# Patient Record
Sex: Male | Born: 1965 | Race: White | Hispanic: No | Marital: Married | Smoking: Never smoker
Health system: Southern US, Community
[De-identification: ages and names within clinical notes are randomized; demographics above are authoritative.]

## PROBLEM LIST (undated history)

## (undated) DIAGNOSIS — K760 Fatty (change of) liver, not elsewhere classified: Secondary | ICD-10-CM

## (undated) DIAGNOSIS — K219 Gastro-esophageal reflux disease without esophagitis: Secondary | ICD-10-CM

## (undated) DIAGNOSIS — F4310 Post-traumatic stress disorder, unspecified: Secondary | ICD-10-CM

## (undated) DIAGNOSIS — I10 Essential (primary) hypertension: Secondary | ICD-10-CM

## (undated) HISTORY — PX: WISDOM TOOTH EXTRACTION: SHX21

## (undated) HISTORY — DX: Gastro-esophageal reflux disease without esophagitis: K21.9

## (undated) HISTORY — DX: Essential (primary) hypertension: I10

## (undated) HISTORY — PX: LIVER BIOPSY: SHX301

## (undated) HISTORY — DX: Fatty (change of) liver, not elsewhere classified: K76.0

## (undated) HISTORY — DX: Hemochromatosis, unspecified: E83.119

## (undated) HISTORY — DX: Post-traumatic stress disorder, unspecified: F43.10

---

## 2000-06-26 ENCOUNTER — Ambulatory Visit (HOSPITAL_COMMUNITY): Admission: RE | Admit: 2000-06-26 | Discharge: 2000-06-26 | Payer: Self-pay | Admitting: Internal Medicine

## 2001-04-11 ENCOUNTER — Ambulatory Visit (HOSPITAL_COMMUNITY): Admission: RE | Admit: 2001-04-11 | Discharge: 2001-04-11 | Payer: Self-pay | Admitting: Internal Medicine

## 2001-07-18 ENCOUNTER — Ambulatory Visit (HOSPITAL_COMMUNITY): Admission: RE | Admit: 2001-07-18 | Discharge: 2001-07-18 | Payer: Self-pay | Admitting: Internal Medicine

## 2001-10-16 ENCOUNTER — Ambulatory Visit (HOSPITAL_COMMUNITY): Admission: RE | Admit: 2001-10-16 | Discharge: 2001-10-16 | Payer: Self-pay | Admitting: Internal Medicine

## 2002-10-29 ENCOUNTER — Ambulatory Visit (HOSPITAL_COMMUNITY): Admission: RE | Admit: 2002-10-29 | Discharge: 2002-10-29 | Payer: Self-pay | Admitting: Internal Medicine

## 2003-06-03 ENCOUNTER — Encounter (HOSPITAL_COMMUNITY): Admission: RE | Admit: 2003-06-03 | Discharge: 2003-07-03 | Payer: Self-pay | Admitting: Internal Medicine

## 2003-07-28 ENCOUNTER — Encounter (HOSPITAL_COMMUNITY): Admission: RE | Admit: 2003-07-28 | Discharge: 2003-08-27 | Payer: Self-pay | Admitting: Internal Medicine

## 2003-09-01 ENCOUNTER — Ambulatory Visit (HOSPITAL_COMMUNITY): Admission: RE | Admit: 2003-09-01 | Discharge: 2003-09-01 | Payer: Self-pay | Admitting: Internal Medicine

## 2003-12-16 ENCOUNTER — Encounter (HOSPITAL_COMMUNITY): Admission: RE | Admit: 2003-12-16 | Discharge: 2004-01-15 | Payer: Self-pay | Admitting: Internal Medicine

## 2004-04-05 ENCOUNTER — Encounter (HOSPITAL_COMMUNITY): Admission: RE | Admit: 2004-04-05 | Discharge: 2004-05-05 | Payer: Self-pay | Admitting: Oncology

## 2004-06-15 ENCOUNTER — Ambulatory Visit (HOSPITAL_COMMUNITY): Admission: RE | Admit: 2004-06-15 | Discharge: 2004-06-15 | Payer: Self-pay | Admitting: Internal Medicine

## 2004-12-14 ENCOUNTER — Ambulatory Visit (HOSPITAL_COMMUNITY): Admission: RE | Admit: 2004-12-14 | Discharge: 2004-12-14 | Payer: Self-pay | Admitting: *Deleted

## 2005-01-16 ENCOUNTER — Ambulatory Visit: Payer: Self-pay | Admitting: Internal Medicine

## 2005-01-30 ENCOUNTER — Ambulatory Visit (HOSPITAL_COMMUNITY): Admission: RE | Admit: 2005-01-30 | Discharge: 2005-01-30 | Payer: Self-pay | Admitting: Internal Medicine

## 2005-06-19 ENCOUNTER — Ambulatory Visit (HOSPITAL_COMMUNITY): Admission: RE | Admit: 2005-06-19 | Discharge: 2005-06-19 | Payer: Self-pay | Admitting: Internal Medicine

## 2005-07-19 ENCOUNTER — Ambulatory Visit (HOSPITAL_COMMUNITY): Admission: RE | Admit: 2005-07-19 | Discharge: 2005-07-19 | Payer: Self-pay | Admitting: Internal Medicine

## 2005-11-17 ENCOUNTER — Ambulatory Visit (HOSPITAL_COMMUNITY): Admission: RE | Admit: 2005-11-17 | Discharge: 2005-11-17 | Payer: Self-pay | Admitting: Internal Medicine

## 2006-02-27 ENCOUNTER — Ambulatory Visit (HOSPITAL_COMMUNITY): Admission: RE | Admit: 2006-02-27 | Discharge: 2006-02-27 | Payer: Self-pay | Admitting: Internal Medicine

## 2006-03-01 ENCOUNTER — Ambulatory Visit: Payer: Self-pay | Admitting: Internal Medicine

## 2006-03-25 ENCOUNTER — Emergency Department (HOSPITAL_COMMUNITY): Admission: EM | Admit: 2006-03-25 | Discharge: 2006-03-25 | Payer: Self-pay | Admitting: Emergency Medicine

## 2006-04-09 ENCOUNTER — Ambulatory Visit (HOSPITAL_COMMUNITY): Admission: RE | Admit: 2006-04-09 | Discharge: 2006-04-09 | Payer: Self-pay | Admitting: Internal Medicine

## 2006-05-21 ENCOUNTER — Ambulatory Visit (HOSPITAL_COMMUNITY): Admission: RE | Admit: 2006-05-21 | Discharge: 2006-05-21 | Payer: Self-pay | Admitting: Internal Medicine

## 2006-07-27 ENCOUNTER — Ambulatory Visit (HOSPITAL_COMMUNITY): Admission: RE | Admit: 2006-07-27 | Discharge: 2006-07-27 | Payer: Self-pay | Admitting: Internal Medicine

## 2006-12-26 ENCOUNTER — Ambulatory Visit (HOSPITAL_COMMUNITY): Admission: RE | Admit: 2006-12-26 | Discharge: 2006-12-26 | Payer: Self-pay | Admitting: Internal Medicine

## 2007-01-15 ENCOUNTER — Ambulatory Visit (HOSPITAL_COMMUNITY): Admission: RE | Admit: 2007-01-15 | Discharge: 2007-01-15 | Payer: Self-pay | Admitting: Internal Medicine

## 2007-02-25 ENCOUNTER — Ambulatory Visit (HOSPITAL_COMMUNITY): Admission: RE | Admit: 2007-02-25 | Discharge: 2007-02-25 | Payer: Self-pay | Admitting: Internal Medicine

## 2007-06-08 ENCOUNTER — Emergency Department (HOSPITAL_COMMUNITY): Admission: EM | Admit: 2007-06-08 | Discharge: 2007-06-09 | Payer: Self-pay | Admitting: Emergency Medicine

## 2007-07-10 ENCOUNTER — Ambulatory Visit (HOSPITAL_COMMUNITY): Admission: RE | Admit: 2007-07-10 | Discharge: 2007-07-10 | Payer: Self-pay | Admitting: Internal Medicine

## 2007-07-30 ENCOUNTER — Ambulatory Visit (HOSPITAL_COMMUNITY): Admission: RE | Admit: 2007-07-30 | Discharge: 2007-07-30 | Payer: Self-pay | Admitting: Internal Medicine

## 2007-10-08 ENCOUNTER — Ambulatory Visit (HOSPITAL_COMMUNITY): Admission: RE | Admit: 2007-10-08 | Discharge: 2007-10-08 | Payer: Self-pay | Admitting: Internal Medicine

## 2007-10-10 ENCOUNTER — Encounter: Admission: RE | Admit: 2007-10-10 | Discharge: 2007-10-10 | Payer: Self-pay | Admitting: Sports Medicine

## 2008-01-20 ENCOUNTER — Ambulatory Visit (HOSPITAL_COMMUNITY): Admission: RE | Admit: 2008-01-20 | Discharge: 2008-01-20 | Payer: Self-pay | Admitting: Internal Medicine

## 2008-04-01 ENCOUNTER — Ambulatory Visit (HOSPITAL_COMMUNITY): Admission: RE | Admit: 2008-04-01 | Discharge: 2008-04-01 | Payer: Self-pay | Admitting: Internal Medicine

## 2008-04-07 ENCOUNTER — Ambulatory Visit (HOSPITAL_COMMUNITY): Admission: RE | Admit: 2008-04-07 | Discharge: 2008-04-07 | Payer: Self-pay | Admitting: Internal Medicine

## 2008-08-17 ENCOUNTER — Ambulatory Visit (HOSPITAL_COMMUNITY): Admission: RE | Admit: 2008-08-17 | Discharge: 2008-08-17 | Payer: Self-pay | Admitting: Internal Medicine

## 2008-09-16 ENCOUNTER — Ambulatory Visit (HOSPITAL_COMMUNITY): Admission: RE | Admit: 2008-09-16 | Discharge: 2008-09-16 | Payer: Self-pay | Admitting: Internal Medicine

## 2009-05-10 ENCOUNTER — Ambulatory Visit (HOSPITAL_COMMUNITY): Admission: RE | Admit: 2009-05-10 | Discharge: 2009-05-10 | Payer: Self-pay | Admitting: Internal Medicine

## 2009-09-14 ENCOUNTER — Ambulatory Visit (HOSPITAL_COMMUNITY): Payer: Self-pay | Admitting: Internal Medicine

## 2009-09-14 ENCOUNTER — Encounter (HOSPITAL_COMMUNITY): Admission: RE | Admit: 2009-09-14 | Discharge: 2009-10-14 | Payer: Self-pay | Admitting: Internal Medicine

## 2009-12-03 ENCOUNTER — Ambulatory Visit (HOSPITAL_COMMUNITY): Payer: Self-pay | Admitting: Internal Medicine

## 2009-12-03 ENCOUNTER — Encounter (HOSPITAL_COMMUNITY)
Admission: RE | Admit: 2009-12-03 | Discharge: 2010-01-02 | Payer: Self-pay | Source: Home / Self Care | Admitting: Internal Medicine

## 2010-01-25 ENCOUNTER — Encounter (HOSPITAL_COMMUNITY)
Admission: RE | Admit: 2010-01-25 | Discharge: 2010-02-24 | Payer: Self-pay | Source: Home / Self Care | Attending: Internal Medicine | Admitting: Internal Medicine

## 2010-01-25 ENCOUNTER — Ambulatory Visit (HOSPITAL_COMMUNITY): Payer: Self-pay | Admitting: Internal Medicine

## 2010-04-06 ENCOUNTER — Encounter (HOSPITAL_COMMUNITY): Payer: 59

## 2010-04-20 ENCOUNTER — Encounter (HOSPITAL_COMMUNITY): Payer: 59 | Attending: Oncology

## 2010-04-20 ENCOUNTER — Encounter (HOSPITAL_COMMUNITY): Payer: 59

## 2010-05-11 ENCOUNTER — Emergency Department (HOSPITAL_COMMUNITY)
Admission: EM | Admit: 2010-05-11 | Discharge: 2010-05-11 | Disposition: A | Discharge status: Home / Self Care | Payer: 59 | Attending: Emergency Medicine | Admitting: Emergency Medicine

## 2010-05-11 DIAGNOSIS — H5789 Other specified disorders of eye and adnexa: Secondary | ICD-10-CM | POA: Insufficient documentation

## 2010-05-11 DIAGNOSIS — H5710 Ocular pain, unspecified eye: Secondary | ICD-10-CM | POA: Insufficient documentation

## 2010-05-11 DIAGNOSIS — X58XXXA Exposure to other specified factors, initial encounter: Secondary | ICD-10-CM | POA: Insufficient documentation

## 2010-05-11 DIAGNOSIS — S058X9A Other injuries of unspecified eye and orbit, initial encounter: Secondary | ICD-10-CM | POA: Insufficient documentation

## 2010-05-15 LAB — THERAPEUTIC PHLEBOTOMY

## 2010-05-29 LAB — THERAPEUTIC PHLEBOTOMY: Volume Drawn: 500

## 2010-06-29 ENCOUNTER — Encounter (HOSPITAL_COMMUNITY): Payer: 59

## 2010-07-06 ENCOUNTER — Encounter (HOSPITAL_COMMUNITY): Payer: 59 | Attending: Oncology

## 2010-07-26 ENCOUNTER — Ambulatory Visit (INDEPENDENT_AMBULATORY_CARE_PROVIDER_SITE_OTHER): Payer: 59 | Admitting: Internal Medicine

## 2010-09-14 ENCOUNTER — Encounter (HOSPITAL_COMMUNITY): Payer: 59 | Attending: Oncology

## 2010-09-14 LAB — COMPREHENSIVE METABOLIC PANEL
BUN: 16 mg/dL (ref 6–23)
CO2: 25 mEq/L (ref 19–32)
Chloride: 101 mEq/L (ref 96–112)
Creatinine, Ser: 1.29 mg/dL (ref 0.50–1.35)
GFR calc Af Amer: >60 mL/min (ref >60)
GFR calc non Af Amer: 60 mL/min — ABNORMAL LOW (ref >60)
Glucose, Bld: 83 mg/dL (ref 70–99)
Total Bilirubin: 0.6 mg/dL (ref 0.3–1.2)

## 2010-09-14 LAB — CBC
HCT: 42.6 % (ref 39.0–52.0)
MCH: 30.4 pg (ref 26.0–34.0)
MCV: 88.6 fL (ref 78.0–100.0)
RBC: 4.81 MIL/uL (ref 4.22–5.81)
WBC: 5.9 10*3/uL (ref 4.0–10.5)

## 2010-09-14 LAB — IRON AND TIBC
Iron: 156 ug/dL — ABNORMAL HIGH (ref 42–135)
TIBC: 261 ug/dL (ref 215–435)

## 2010-09-14 NOTE — Progress Notes (Signed)
Phlebotomy started at 0948 and completed at 0954. 500 cc removed. Vitals stable. Patient tolerated well. (Vitals obtained before phlebotomy and they were stable however I did not enter the BP/HR because it disappeared from the screen and I could not retrieve).

## 2010-09-21 ENCOUNTER — Telehealth (INDEPENDENT_AMBULATORY_CARE_PROVIDER_SITE_OTHER): Payer: Self-pay | Admitting: *Deleted

## 2010-09-21 NOTE — Telephone Encounter (Signed)
The patient was called and made aware of recent lab results and Dr. Tenna Child recommendation to have Phlebotomy every 8 weeks due to increased Iron and Saturation Levels. Iron Studies are to obtained at the time of the 3rd Phlebotomy, and patient to have  Office visit with Dr. Karilyn Cota.

## 2010-09-22 ENCOUNTER — Encounter (INDEPENDENT_AMBULATORY_CARE_PROVIDER_SITE_OTHER): Payer: Self-pay | Admitting: *Deleted

## 2010-09-22 NOTE — Progress Notes (Signed)
APPT SET NUR NEXT AVALB KR

## 2010-10-13 ENCOUNTER — Ambulatory Visit (INDEPENDENT_AMBULATORY_CARE_PROVIDER_SITE_OTHER): Payer: 59 | Admitting: Internal Medicine

## 2010-10-26 ENCOUNTER — Encounter (HOSPITAL_COMMUNITY): Payer: 59

## 2010-11-09 ENCOUNTER — Encounter (HOSPITAL_COMMUNITY): Payer: 59 | Attending: Oncology

## 2010-11-09 NOTE — Progress Notes (Signed)
vp x 1 to lt ac for phlebotomy. 500cc of blood removed. IV site wnl. Patient tolerated very well.

## 2010-11-10 LAB — THERAPEUTIC PHLEBOTOMY: Volume Drawn: 500

## 2010-11-16 LAB — THERAPEUTIC PHLEBOTOMY

## 2010-11-17 LAB — THERAPEUTIC PHLEBOTOMY

## 2010-11-22 LAB — THERAPEUTIC PHLEBOTOMY

## 2010-11-23 ENCOUNTER — Encounter (HOSPITAL_COMMUNITY): Payer: 59

## 2010-11-29 LAB — THERAPEUTIC PHLEBOTOMY

## 2011-01-04 ENCOUNTER — Encounter (HOSPITAL_COMMUNITY): Payer: 59

## 2011-01-04 ENCOUNTER — Encounter (HOSPITAL_COMMUNITY): Payer: 59 | Attending: Internal Medicine

## 2011-01-04 NOTE — Progress Notes (Signed)
Phlebotomy started at 0910 and ended at 0915. 500 cc blood removed. IV site WNL. Patient tolerated well.

## 2011-03-01 ENCOUNTER — Other Ambulatory Visit (HOSPITAL_COMMUNITY): Payer: 59

## 2011-03-01 ENCOUNTER — Encounter (HOSPITAL_COMMUNITY): Payer: 59

## 2011-03-01 ENCOUNTER — Encounter (HOSPITAL_COMMUNITY): Payer: 59 | Attending: Internal Medicine

## 2011-03-01 LAB — CBC
MCV: 87.3 fL (ref 78.0–100.0)
Platelets: 207 10*3/uL (ref 150–400)
RBC: 5.21 MIL/uL (ref 4.22–5.81)
WBC: 5.7 10*3/uL (ref 4.0–10.5)

## 2011-03-01 LAB — COMPREHENSIVE METABOLIC PANEL
ALT: 37 U/L (ref 0–53)
AST: 26 U/L (ref 0–37)
CO2: 30 mEq/L (ref 19–32)
Chloride: 104 mEq/L (ref 96–112)
Creatinine, Ser: 1.21 mg/dL (ref 0.50–1.35)
GFR calc non Af Amer: 70 mL/min — ABNORMAL LOW (ref >90)
Sodium: 139 mEq/L (ref 135–145)
Total Bilirubin: 0.5 mg/dL (ref 0.3–1.2)

## 2011-03-02 LAB — IRON AND TIBC
Saturation Ratios: 63 % — ABNORMAL HIGH (ref 20–55)
TIBC: 254 ug/dL (ref 215–435)
UIBC: 94 ug/dL — ABNORMAL LOW (ref 125–400)

## 2011-03-08 ENCOUNTER — Encounter (HOSPITAL_COMMUNITY): Payer: 59

## 2011-03-09 ENCOUNTER — Telehealth (INDEPENDENT_AMBULATORY_CARE_PROVIDER_SITE_OTHER): Payer: Self-pay | Admitting: *Deleted

## 2011-03-09 NOTE — Telephone Encounter (Signed)
Per NUR the patient will need labs on 6 months, they are noted. patient will be sent a letter as a reminder

## 2011-04-26 ENCOUNTER — Encounter (HOSPITAL_COMMUNITY): Payer: 59 | Attending: Internal Medicine

## 2011-04-26 NOTE — Progress Notes (Signed)
Billy Myers presents today for phlebotomy per MD orders. Phlebotomy procedure started at 0920 am and ended at 0925 am. 500 cc removed. Patient observed for 30 minutes after procedure without any incident. Patient tolerated procedure well. IV needle removed intact.

## 2011-06-07 ENCOUNTER — Other Ambulatory Visit: Payer: Self-pay | Admitting: Occupational Medicine

## 2011-06-07 ENCOUNTER — Ambulatory Visit: Payer: Self-pay

## 2011-06-07 DIAGNOSIS — R52 Pain, unspecified: Secondary | ICD-10-CM

## 2011-06-21 ENCOUNTER — Encounter (HOSPITAL_COMMUNITY): Payer: 59

## 2011-07-05 ENCOUNTER — Encounter (HOSPITAL_COMMUNITY): Payer: 59

## 2011-07-19 ENCOUNTER — Encounter (HOSPITAL_COMMUNITY): Payer: 59

## 2011-07-26 ENCOUNTER — Encounter (HOSPITAL_COMMUNITY): Payer: 59 | Attending: Internal Medicine

## 2011-07-26 NOTE — Progress Notes (Signed)
Billy Myers presents today for phlebotomy per MD orders. Phlebotomy procedure started at 1015 and ended at  1020. 500 cc removed. Patient tolerated procedure well. IV needle removed intact.

## 2011-08-17 ENCOUNTER — Encounter (INDEPENDENT_AMBULATORY_CARE_PROVIDER_SITE_OTHER): Payer: Self-pay | Admitting: *Deleted

## 2011-08-17 ENCOUNTER — Other Ambulatory Visit (INDEPENDENT_AMBULATORY_CARE_PROVIDER_SITE_OTHER): Payer: Self-pay | Admitting: *Deleted

## 2011-09-27 ENCOUNTER — Encounter (HOSPITAL_COMMUNITY): Payer: 59 | Attending: Internal Medicine

## 2011-09-27 LAB — COMPREHENSIVE METABOLIC PANEL
AST: 25 U/L (ref 0–37)
Alkaline Phosphatase: 67 U/L (ref 39–117)
BUN: 12 mg/dL (ref 6–23)
CO2: 23 mEq/L (ref 19–32)
Chloride: 100 mEq/L (ref 96–112)
Creatinine, Ser: 1.23 mg/dL (ref 0.50–1.35)
GFR calc non Af Amer: 68 mL/min — ABNORMAL LOW (ref >90)
Potassium: 3.3 mEq/L — ABNORMAL LOW (ref 3.5–5.1)
Total Bilirubin: 0.6 mg/dL (ref 0.3–1.2)

## 2011-09-27 LAB — CBC
Platelets: 234 10*3/uL (ref 150–400)
RBC: 5.29 MIL/uL (ref 4.22–5.81)
RDW: 13 % (ref 11.5–15.5)
WBC: 7.2 10*3/uL (ref 4.0–10.5)

## 2011-09-27 LAB — DIFFERENTIAL
Basophils Absolute: 0.1 10*3/uL (ref 0.0–0.1)
Eosinophils Relative: 2 % (ref 0–5)
Lymphocytes Relative: 22 % (ref 12–46)
Neutro Abs: 5 10*3/uL (ref 1.7–7.7)
Neutrophils Relative %: 69 % (ref 43–77)

## 2011-09-27 NOTE — Progress Notes (Signed)
Berneta Sages Funderburk presents today for phlebotomy per MD orders. Phlebotomy procedure started at 0926 and ended at  0934. 500 cc removed. Patient tolerated procedure well. IV needle removed intact.  First BP taken before procedure at 0925 was 139/92  [facility] presented for labwork. Labs per MD order drawn via Peripheral Line 24 gauge needle inserted in rt ac  Good blood return present. Procedure without incident.  Needle removed intact. Patient tolerated procedure well.

## 2011-09-28 LAB — IRON AND TIBC: TIBC: 240 ug/dL (ref 215–435)

## 2011-09-28 LAB — FERRITIN: Ferritin: 51 ng/mL (ref 22–322)

## 2011-10-04 ENCOUNTER — Encounter (INDEPENDENT_AMBULATORY_CARE_PROVIDER_SITE_OTHER): Payer: Self-pay | Admitting: Internal Medicine

## 2011-10-04 ENCOUNTER — Ambulatory Visit (INDEPENDENT_AMBULATORY_CARE_PROVIDER_SITE_OTHER): Payer: 59 | Admitting: Internal Medicine

## 2011-10-04 DIAGNOSIS — E876 Hypokalemia: Secondary | ICD-10-CM

## 2011-10-04 NOTE — Patient Instructions (Signed)
Fasting metabolic 7 to be done in first week of September. Phlebotomy once a month. CBC, serum iron, TIBC ferritin with sixth phlebotomy.

## 2011-10-04 NOTE — Progress Notes (Signed)
Presenting complaint;  Followup for hemochromatosis. Hemochromatosis diagnosed March 1999.  Subjective:  Patient is 46 year old Caucasian male who was diagnosed with hemachromatosis 14 years ago and is here for scheduled visit. He was last seen in March 2011 needed in the office. He has no complaints. He has good appetite. He says she works 18 hours a day on most days and is working and under a lot of stress. He states his stress level will decrease as his job assignment changes. He remains with good appetite. He denies abdominal pain. He is not exercising like he was doing before. His weight is down by about 2 pounds since his last visit. He is getting phlebotomy every 2 months.  Current Medications: Current Outpatient Prescriptions  Medication Sig Dispense Refill  . KLOR-CON M20 20 MEQ tablet 20 mEq daily. For 10 days         Objective: Blood pressure 130/88, pulse 76, temperature 98.4 F (36.9 C), temperature source Oral, resp. rate 20, height 6' (1.829 m), weight 206 lb 12.8 oz (93.804 kg). Patient is alert and in no acute distress. Conjunctiva is pink. Sclera is nonicteric Oropharyngeal mucosa is normal. No neck masses or thyromegaly noted. Abdomen is symmetrical soft and nontender. No organomegaly or masses noted. No LE edema or clubbing noted.  Labs/studies Results: Lab data from 09/27/2011. WBC 7.2, H&H 15.7 and 46.7 platelet count 234K Serum sodium 137, potassium 3.3, chloride 100, CO2 23, BUN 12 creatinine 1.23. Glucose 124 Bilirubin 0.6, AP 67, AST 25, ALT 30, albumin 4.1. Calcium 9.5. Serum iron 185, TIBC 240, saturation 77% and serum ferritin. Saturation was 63% and ferritin 26 in January 2013 to   Assessment:  #1 hemochromatosis. Current schedule of phlebotomy every 8 weeks is not enough for him S. iron stores are going in the wrong direction. Transaminases are normal which is reassuring. His phlebotomy needs to be at shorter intervals. #2. Hypokalemia. Patient  does not take diuretics and he does not have diarrhea and he is on regular diet. He was begun on KCL last week for total of 10 days. Doubt that he has kidney disease. #3. Random glucose level of 124.   Plan:  Change phlebotomy schedule to once a month. He'll have iron studies with sixth phlebotomy.  Fasting metabolic 7 in first week of September 2013. Patient was given copy of his blood work. Office visit in one year.

## 2011-10-05 ENCOUNTER — Telehealth (INDEPENDENT_AMBULATORY_CARE_PROVIDER_SITE_OTHER): Payer: Self-pay | Admitting: *Deleted

## 2011-10-05 DIAGNOSIS — E876 Hypokalemia: Secondary | ICD-10-CM

## 2011-10-05 NOTE — Telephone Encounter (Signed)
Per Dr.Rehman the patient will need to have fasting B-Met the first week in September 2013

## 2011-10-19 ENCOUNTER — Encounter (INDEPENDENT_AMBULATORY_CARE_PROVIDER_SITE_OTHER): Payer: Self-pay | Admitting: *Deleted

## 2011-10-19 ENCOUNTER — Other Ambulatory Visit (INDEPENDENT_AMBULATORY_CARE_PROVIDER_SITE_OTHER): Payer: Self-pay | Admitting: *Deleted

## 2011-10-19 DIAGNOSIS — E876 Hypokalemia: Secondary | ICD-10-CM

## 2011-11-15 ENCOUNTER — Ambulatory Visit (HOSPITAL_BASED_OUTPATIENT_CLINIC_OR_DEPARTMENT_OTHER): Payer: 59

## 2011-11-15 NOTE — Progress Notes (Signed)
Billy Myers presents today for phlebotomy per MD orders. Phlebotomy procedure started at 1159 and ended at 1215. 500 cc removed. Patient tolerated procedure well. IV needle removed intact.

## 2011-11-22 ENCOUNTER — Encounter (HOSPITAL_COMMUNITY): Payer: Self-pay

## 2011-12-12 ENCOUNTER — Encounter (HOSPITAL_COMMUNITY): Payer: 59 | Attending: Internal Medicine

## 2011-12-12 NOTE — Progress Notes (Signed)
Billy Myers presents today for phlebotomy per MD orders. Phlebotomy procedure started at 1148 and ended at 1154. 500 cc removed. Patient tolerated procedure well. IV needle removed intact.

## 2011-12-13 ENCOUNTER — Encounter (HOSPITAL_COMMUNITY): Payer: Self-pay

## 2012-01-10 ENCOUNTER — Encounter (HOSPITAL_COMMUNITY): Payer: Self-pay

## 2012-01-10 ENCOUNTER — Encounter (HOSPITAL_COMMUNITY): Payer: 59

## 2012-02-07 ENCOUNTER — Encounter (HOSPITAL_COMMUNITY): Payer: 59 | Attending: Internal Medicine

## 2012-02-07 NOTE — Progress Notes (Signed)
Billy Myers presents today for phlebotomy per MD orders. Phlebotomy procedure started at 0852 and ended at 0857. 500 cc removed. Patient tolerated procedure well. IV needle removed intact.

## 2012-03-06 ENCOUNTER — Encounter (HOSPITAL_COMMUNITY): Payer: Self-pay

## 2012-03-13 ENCOUNTER — Encounter (HOSPITAL_COMMUNITY): Payer: 59 | Attending: Internal Medicine

## 2012-03-13 NOTE — Progress Notes (Signed)
Billy Myers presents today for phlebotomy per MD orders. Phlebotomy procedure started at 1009 and ended at 1018. 500 cc removed. Patient tolerated procedure well. IV needle removed intact.

## 2012-04-03 ENCOUNTER — Encounter (HOSPITAL_COMMUNITY): Payer: 59 | Attending: Internal Medicine

## 2012-04-03 LAB — CBC
HCT: 42.4 % (ref 39.0–52.0)
Hemoglobin: 13.9 g/dL (ref 13.0–17.0)
RDW: 13.1 % (ref 11.5–15.5)
WBC: 5.2 10*3/uL (ref 4.0–10.5)

## 2012-04-03 NOTE — Progress Notes (Signed)
Billy Myers presents today for phlebotomy per MD orders. Phlebotomy procedure started at 0913 and ended at 0918. 500 cc removed. Patient tolerated procedure well. IV needle removed intact.

## 2012-04-04 LAB — FERRITIN: Ferritin: 11 ng/mL — ABNORMAL LOW (ref 22–322)

## 2012-04-04 LAB — IRON AND TIBC
TIBC: 234 ug/dL (ref 215–435)
UIBC: 199 ug/dL (ref 125–400)

## 2012-06-05 ENCOUNTER — Encounter (HOSPITAL_COMMUNITY): Payer: 59 | Attending: Internal Medicine

## 2012-06-05 NOTE — Progress Notes (Signed)
Billy Myers presents today for phlebotomy per MD orders. Phlebotomy procedure started at 1010 and ended at 1016. 500 cc removed. Patient tolerated procedure well. IV needle removed intact.

## 2012-08-07 ENCOUNTER — Encounter (HOSPITAL_COMMUNITY): Payer: 59 | Attending: Internal Medicine

## 2012-08-07 LAB — IRON AND TIBC
Iron: 82 ug/dL (ref 42–135)
Saturation Ratios: 31 % (ref 20–55)
TIBC: 262 ug/dL (ref 215–435)

## 2012-08-07 LAB — CBC
Hemoglobin: 13.5 g/dL (ref 13.0–17.0)
MCH: 26.2 pg (ref 26.0–34.0)
Platelets: 217 10*3/uL (ref 150–400)
RBC: 5.16 MIL/uL (ref 4.22–5.81)
WBC: 4.7 10*3/uL (ref 4.0–10.5)

## 2012-08-07 NOTE — Progress Notes (Signed)
Billy Myers presents today for phlebotomy per MD orders. Phlebotomy procedure started at 0940 and ended at 0945. 500 cc removed. Patient tolerated procedure well. IV needle removed intact.

## 2012-08-22 ENCOUNTER — Telehealth (INDEPENDENT_AMBULATORY_CARE_PROVIDER_SITE_OTHER): Payer: Self-pay | Admitting: *Deleted

## 2012-08-22 NOTE — Telephone Encounter (Signed)
Per Dr.Rehman the patient will need to have lab work in 6 months. These labs have been noted for 6 months.

## 2012-09-02 ENCOUNTER — Other Ambulatory Visit (HOSPITAL_COMMUNITY): Payer: Self-pay | Admitting: *Deleted

## 2012-10-07 ENCOUNTER — Encounter (INDEPENDENT_AMBULATORY_CARE_PROVIDER_SITE_OTHER): Payer: Self-pay | Admitting: *Deleted

## 2012-10-09 ENCOUNTER — Encounter (HOSPITAL_COMMUNITY): Payer: 59 | Attending: Internal Medicine

## 2012-10-09 NOTE — Progress Notes (Signed)
Berneta Sages Sisemore presents today for phlebotomy per MD orders. Phlebotomy procedure started at 0930 and ended at 0940. 500 cc removed. Patient tolerated procedure well. IV needle removed intact.

## 2012-10-17 ENCOUNTER — Ambulatory Visit (INDEPENDENT_AMBULATORY_CARE_PROVIDER_SITE_OTHER): Payer: Self-pay | Admitting: Internal Medicine

## 2012-11-27 ENCOUNTER — Encounter (HOSPITAL_COMMUNITY)
Admission: RE | Admit: 2012-11-27 | Discharge: 2012-11-27 | Disposition: A | Discharge status: Home / Self Care | Payer: 59 | Source: Ambulatory Visit | Attending: Internal Medicine | Admitting: Internal Medicine

## 2012-11-27 ENCOUNTER — Encounter (INDEPENDENT_AMBULATORY_CARE_PROVIDER_SITE_OTHER): Payer: Self-pay

## 2012-11-27 NOTE — Progress Notes (Signed)
Vitals before TPR 153/101,68,20,99%,weight 211.2 8:32 therapeutic phlebotomy began then ended at 8:37 post vital 129/85 98% 74 20, right antcubital site  Without bleeding once needle remove and pressure applied. Monitored patient 15 mins post therapy patient able to take fluids well. No dizziness no shakiness verified by patient. Ambulatory discharge.

## 2012-12-11 ENCOUNTER — Encounter (HOSPITAL_COMMUNITY): Payer: Self-pay

## 2013-02-04 ENCOUNTER — Encounter (HOSPITAL_COMMUNITY)
Admission: RE | Admit: 2013-02-04 | Discharge: 2013-02-04 | Disposition: A | Discharge status: Home / Self Care | Payer: 59 | Source: Ambulatory Visit

## 2013-02-04 ENCOUNTER — Encounter (HOSPITAL_COMMUNITY): Payer: Self-pay

## 2013-02-04 ENCOUNTER — Encounter (HOSPITAL_COMMUNITY)
Admission: RE | Admit: 2013-02-04 | Discharge: 2013-02-04 | Disposition: A | Discharge status: Home / Self Care | Payer: 59 | Source: Ambulatory Visit | Attending: Internal Medicine | Admitting: Internal Medicine

## 2013-02-04 LAB — CBC
HCT: 43.3 % (ref 39.0–52.0)
Hemoglobin: 13.9 g/dL (ref 13.0–17.0)
MCHC: 32.1 g/dL (ref 30.0–36.0)
RBC: 5.32 MIL/uL (ref 4.22–5.81)
RDW: 14.6 % (ref 11.5–15.5)
WBC: 5.4 10*3/uL (ref 4.0–10.5)

## 2013-02-04 LAB — FERRITIN: Ferritin: 10 ng/mL — ABNORMAL LOW (ref 22–322)

## 2013-02-04 LAB — IRON AND TIBC
TIBC: 278 ug/dL (ref 215–435)
UIBC: 186 ug/dL (ref 125–400)

## 2013-02-04 NOTE — Progress Notes (Signed)
Here for theraputic phlebotomy. Iron, TIBC, ferritin, CBC drawn and sent to lab for results.

## 2013-02-04 NOTE — Progress Notes (Signed)
Phlebotomy completed. Blood unit wt 1.4 pounds. Tolerated well.

## 2013-02-04 NOTE — Progress Notes (Deleted)
hgb 11.0, hct 35.9. No procrit required. Discharged to home via w/c in good condition.

## 2013-02-04 NOTE — Progress Notes (Signed)
Phlebotomy started.

## 2013-02-05 ENCOUNTER — Inpatient Hospital Stay (HOSPITAL_COMMUNITY): Admission: RE | Admit: 2013-02-05 | Payer: Self-pay | Source: Ambulatory Visit

## 2013-02-05 NOTE — Progress Notes (Signed)
Results for JERIN, FRANZEL (MRN [id number]) as of 02/05/2013 08:15  Ref. Range 02/04/2013 10:32  Iron Latest Range: 42-135 ug/dL 92  UIBC Latest Range: 125-400 ug/dL 295  TIBC Latest Range: 215-435 ug/dL 284  Saturation Ratios Latest Range: 20-55 % 33  Ferritin Latest Range: 22-322 ng/mL 10 (L)  WBC Latest Range: 4.0-10.5 K/uL 5.4  RBC Latest Range: 4.22-5.81 MIL/uL 5.32  Hemoglobin Latest Range: 13.0-17.0 g/dL 13.2  HCT Latest Range: 39.0-52.0 % 43.3  MCV Latest Range: 78.0-100.0 fL 81.4  MCH Latest Range: 26.0-34.0 pg 26.1  MCHC Latest Range: 30.0-36.0 g/dL 44.0  RDW Latest Range: 11.5-15.5 % 14.6  Platelets Latest Range: 150-400 K/uL 217

## 2013-04-07 ENCOUNTER — Encounter (HOSPITAL_COMMUNITY)
Admission: RE | Admit: 2013-04-07 | Discharge: 2013-04-07 | Disposition: A | Discharge status: Home / Self Care | Payer: 59 | Source: Ambulatory Visit | Attending: Internal Medicine | Admitting: Internal Medicine

## 2013-04-07 ENCOUNTER — Inpatient Hospital Stay (HOSPITAL_COMMUNITY): Admission: RE | Admit: 2013-04-07 | Payer: Self-pay | Source: Ambulatory Visit

## 2013-04-07 NOTE — Progress Notes (Signed)
Billy SagesHarold W Myers presents today for phlebotomy per MD orders.  Phlebotomy procedure started at 1347 and ended at 1353. One unit removed. Patient tolerated procedure well. IV needle removed intact.

## 2013-06-02 ENCOUNTER — Encounter (HOSPITAL_COMMUNITY): Admission: RE | Admit: 2013-06-02 | Payer: 59 | Source: Ambulatory Visit

## 2013-06-03 ENCOUNTER — Encounter (HOSPITAL_COMMUNITY)
Admission: RE | Admit: 2013-06-03 | Discharge: 2013-06-03 | Disposition: A | Discharge status: Home / Self Care | Payer: 59 | Source: Ambulatory Visit | Attending: Internal Medicine | Admitting: Internal Medicine

## 2013-06-03 NOTE — Progress Notes (Signed)
Billy Myers presents today for phlebotomy per MD orders.  Phlebotomy procedure started at 1213 and ended at 1219. 1 unit  removed. Patient tolerated procedure well. IV needle removed intact.

## 2013-07-29 ENCOUNTER — Encounter (HOSPITAL_COMMUNITY): Admission: RE | Admit: 2013-07-29 | Payer: 59 | Source: Ambulatory Visit

## 2013-07-29 ENCOUNTER — Inpatient Hospital Stay (HOSPITAL_COMMUNITY): Admission: RE | Admit: 2013-07-29 | Payer: Self-pay | Source: Ambulatory Visit

## 2013-08-01 ENCOUNTER — Encounter (HOSPITAL_COMMUNITY)
Admission: RE | Admit: 2013-08-01 | Discharge: 2013-08-01 | Disposition: A | Discharge status: Home / Self Care | Payer: 59 | Source: Ambulatory Visit | Attending: Internal Medicine | Admitting: Internal Medicine

## 2013-08-01 LAB — CBC
HCT: 41 % (ref 39.0–52.0)
Hemoglobin: 13.2 g/dL (ref 13.0–17.0)
MCH: 25 pg — AB (ref 26.0–34.0)
MCHC: 32.2 g/dL (ref 30.0–36.0)
MCV: 77.8 fL — ABNORMAL LOW (ref 78.0–100.0)
Platelets: 228 10*3/uL (ref 150–400)
RBC: 5.27 MIL/uL (ref 4.22–5.81)
RDW: 14.6 % (ref 11.5–15.5)
WBC: 5.2 10*3/uL (ref 4.0–10.5)

## 2013-08-01 LAB — IRON AND TIBC
Iron: 29 ug/dL — ABNORMAL LOW (ref 42–135)
Saturation Ratios: 10 % — ABNORMAL LOW (ref 20–55)
TIBC: 281 ug/dL (ref 215–435)
UIBC: 252 ug/dL (ref 125–400)

## 2013-08-01 LAB — FERRITIN: FERRITIN: 7 ng/mL — AB (ref 22–322)

## 2013-08-01 NOTE — Progress Notes (Signed)
Results for Billy Myers, Billy Myers (MRN [id number]) as of 08/01/2013 15:32  Ref. Range 08/01/2013 09:30  Iron Latest Range: 42-135 ug/dL 29 (L)  UIBC Latest Range: 125-400 ug/dL 409252  TIBC Latest Range: 215-435 ug/dL 811281  Saturation Ratios Latest Range: 20-55 % 10 (L)  Ferritin Latest Range: 22-322 ng/mL 7 (L)  WBC Latest Range: 4.0-10.5 K/uL 5.2  RBC Latest Range: 4.22-5.81 MIL/uL 5.27  Hemoglobin Latest Range: 13.0-17.0 g/dL 91.413.2  HCT Latest Range: 39.0-52.0 % 41.0  MCV Latest Range: 78.0-100.0 fL 77.8 (L)  MCH Latest Range: 26.0-34.0 pg 25.0 (L)  MCHC Latest Range: 30.0-36.0 g/dL 78.232.2  RDW Latest Range: 11.5-15.5 % 14.6  Platelets Latest Range: 150-400 K/uL 228

## 2013-08-01 NOTE — Progress Notes (Signed)
Billy SagesHarold W Myers presents today for phlebotomy per MD orders. HGB/HCT:pending but not necessary before performing phlebotomy Phlebotomy procedure started at 0926 and ended at 0935. 1 unit removed. Patient tolerated procedure well. IV needle removed intact.

## 2013-08-14 ENCOUNTER — Encounter (INDEPENDENT_AMBULATORY_CARE_PROVIDER_SITE_OTHER): Payer: Self-pay

## 2013-09-23 ENCOUNTER — Encounter (HOSPITAL_COMMUNITY): Payer: Self-pay

## 2013-09-26 ENCOUNTER — Encounter (HOSPITAL_COMMUNITY)
Admission: RE | Admit: 2013-09-26 | Discharge: 2013-09-26 | Disposition: A | Discharge status: Home / Self Care | Payer: 59 | Source: Ambulatory Visit | Attending: Internal Medicine | Admitting: Internal Medicine

## 2015-06-16 HISTORY — PX: COLONOSCOPY: SHX174

## 2015-06-16 HISTORY — PX: ESOPHAGOGASTRODUODENOSCOPY: SHX1529

## 2015-06-16 LAB — HM COLONOSCOPY

## 2015-06-21 HISTORY — PX: ESOPHAGEAL DILATION: SHX303

## 2015-11-03 ENCOUNTER — Encounter (HOSPITAL_COMMUNITY): Payer: Self-pay

## 2015-11-05 ENCOUNTER — Other Ambulatory Visit (HOSPITAL_COMMUNITY): Payer: Self-pay | Admitting: *Deleted

## 2015-11-08 ENCOUNTER — Ambulatory Visit (HOSPITAL_COMMUNITY)
Admission: RE | Admit: 2015-11-08 | Discharge: 2015-11-08 | Disposition: A | Discharge status: Home / Self Care | Payer: 59 | Source: Ambulatory Visit | Attending: Unknown Physician Specialty | Admitting: Unknown Physician Specialty

## 2015-11-09 LAB — POCT HEMOGLOBIN-HEMACUE: Hemoglobin: 15.3 g/dL (ref 13.0–17.0)

## 2015-12-14 ENCOUNTER — Other Ambulatory Visit (HOSPITAL_COMMUNITY): Payer: Self-pay

## 2015-12-15 ENCOUNTER — Inpatient Hospital Stay (HOSPITAL_COMMUNITY): Admission: RE | Admit: 2015-12-15 | Payer: Self-pay | Source: Ambulatory Visit

## 2016-01-25 ENCOUNTER — Ambulatory Visit (HOSPITAL_COMMUNITY)
Admission: RE | Admit: 2016-01-25 | Discharge: 2016-01-25 | Disposition: A | Discharge status: Home / Self Care | Payer: 59 | Source: Ambulatory Visit | Attending: Unknown Physician Specialty | Admitting: Unknown Physician Specialty

## 2016-01-25 LAB — POCT HEMOGLOBIN-HEMACUE: Hemoglobin: 16 g/dL (ref 13.0–17.0)

## 2016-01-25 NOTE — Progress Notes (Signed)
Pt came in today for scheduled therapeutic phlebotomy.  Pts hemocue prior to procedure was 16.0  Left AC was used.  500cc of blood was removed.  Pt tolerated procedure well.

## 2016-03-01 ENCOUNTER — Inpatient Hospital Stay (HOSPITAL_COMMUNITY): Admission: RE | Admit: 2016-03-01 | Payer: Self-pay | Source: Ambulatory Visit

## 2016-05-29 ENCOUNTER — Ambulatory Visit
Admission: RE | Admit: 2016-05-29 | Discharge: 2016-05-29 | Disposition: A | Discharge status: Home / Self Care | Payer: No Typology Code available for payment source | Source: Ambulatory Visit | Attending: Occupational Medicine | Admitting: Occupational Medicine

## 2016-05-29 ENCOUNTER — Other Ambulatory Visit: Payer: Self-pay | Admitting: Occupational Medicine

## 2016-05-29 DIAGNOSIS — Z0289 Encounter for other administrative examinations: Secondary | ICD-10-CM

## 2018-06-04 IMAGING — CR DG CHEST 2V
2 series · 2 of 2 positions shown · non-contrast
Comparison: None

CLINICAL DATA: Routine encounter for physical exam related to
employment

EXAM:
CHEST  2 VIEW

[w chest pa]
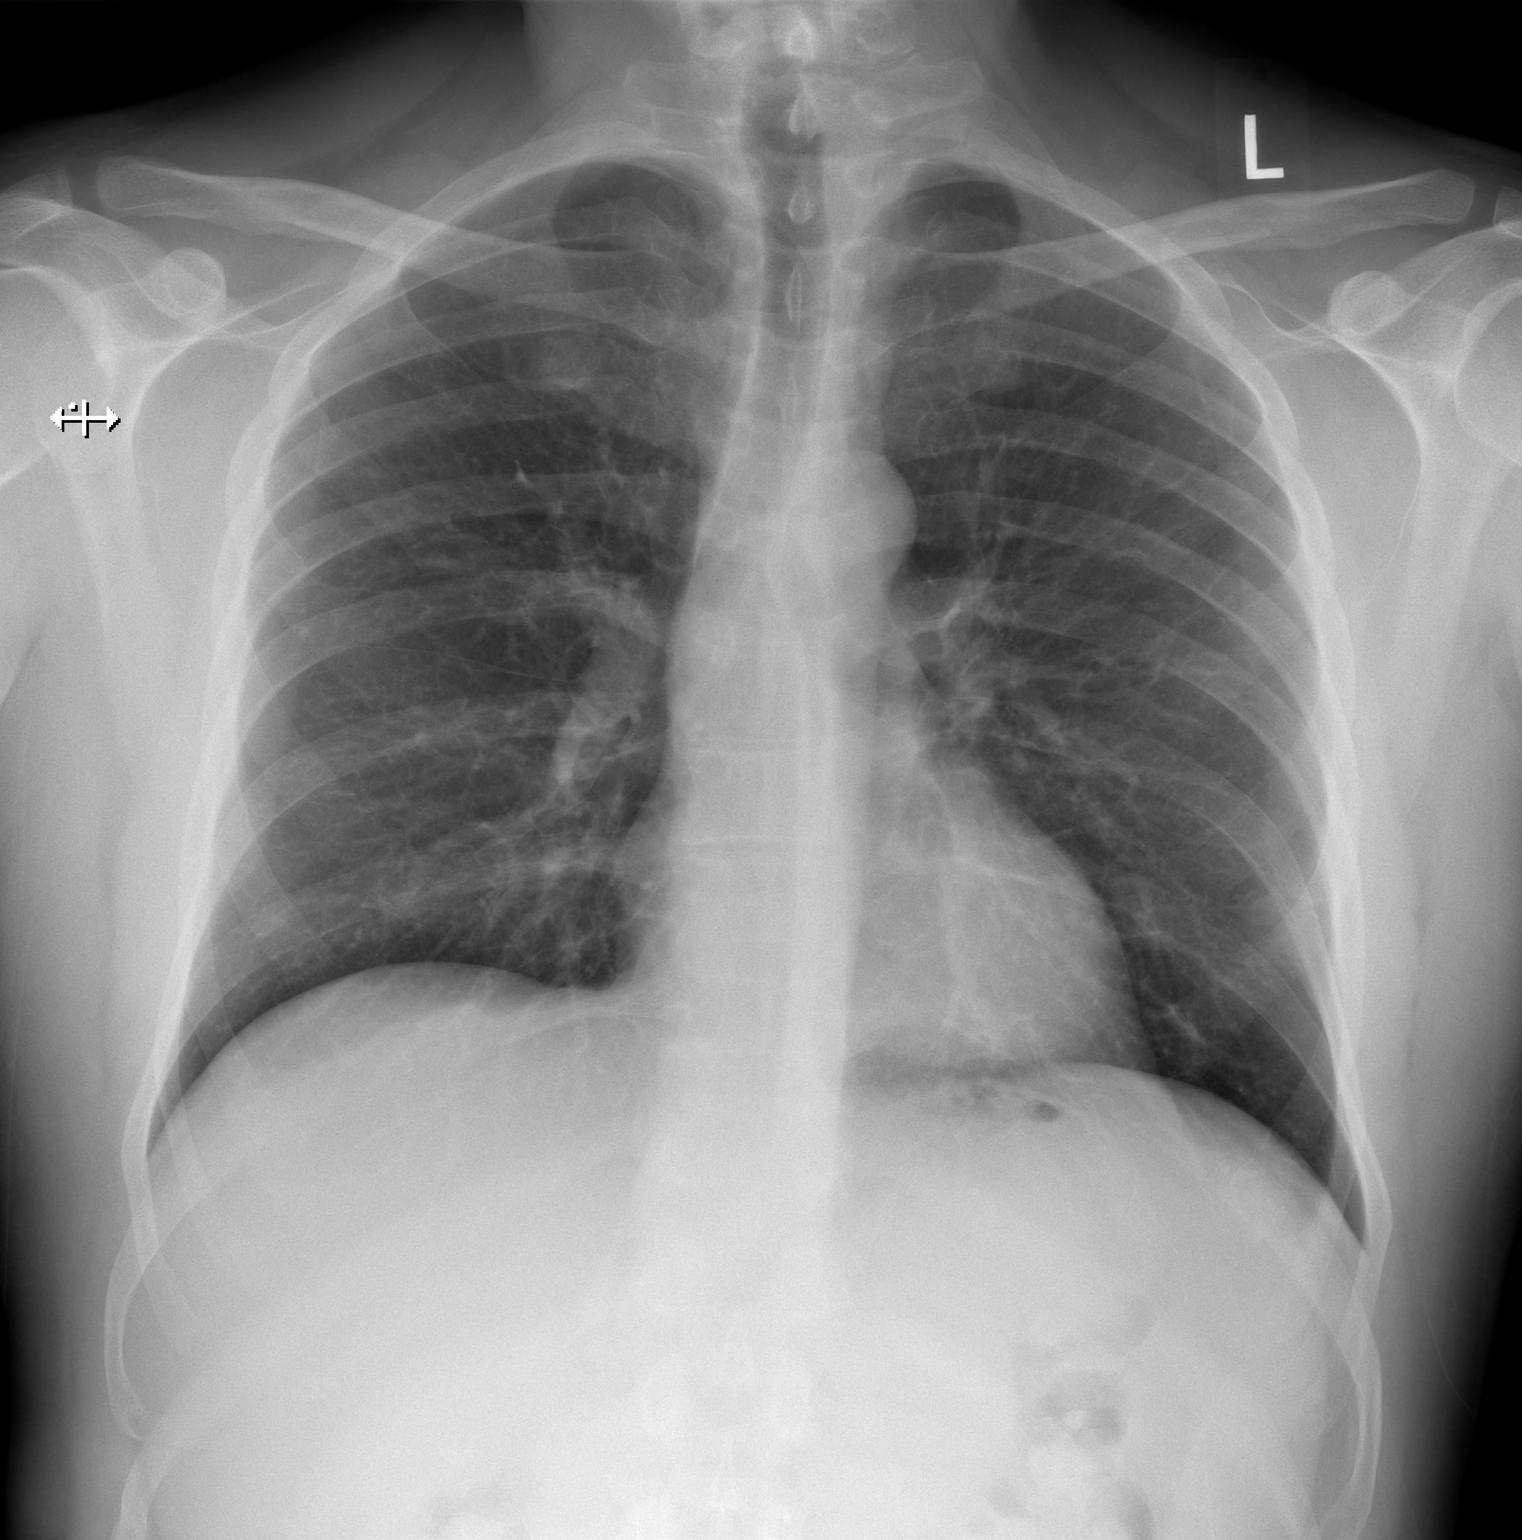

[w chest lat]
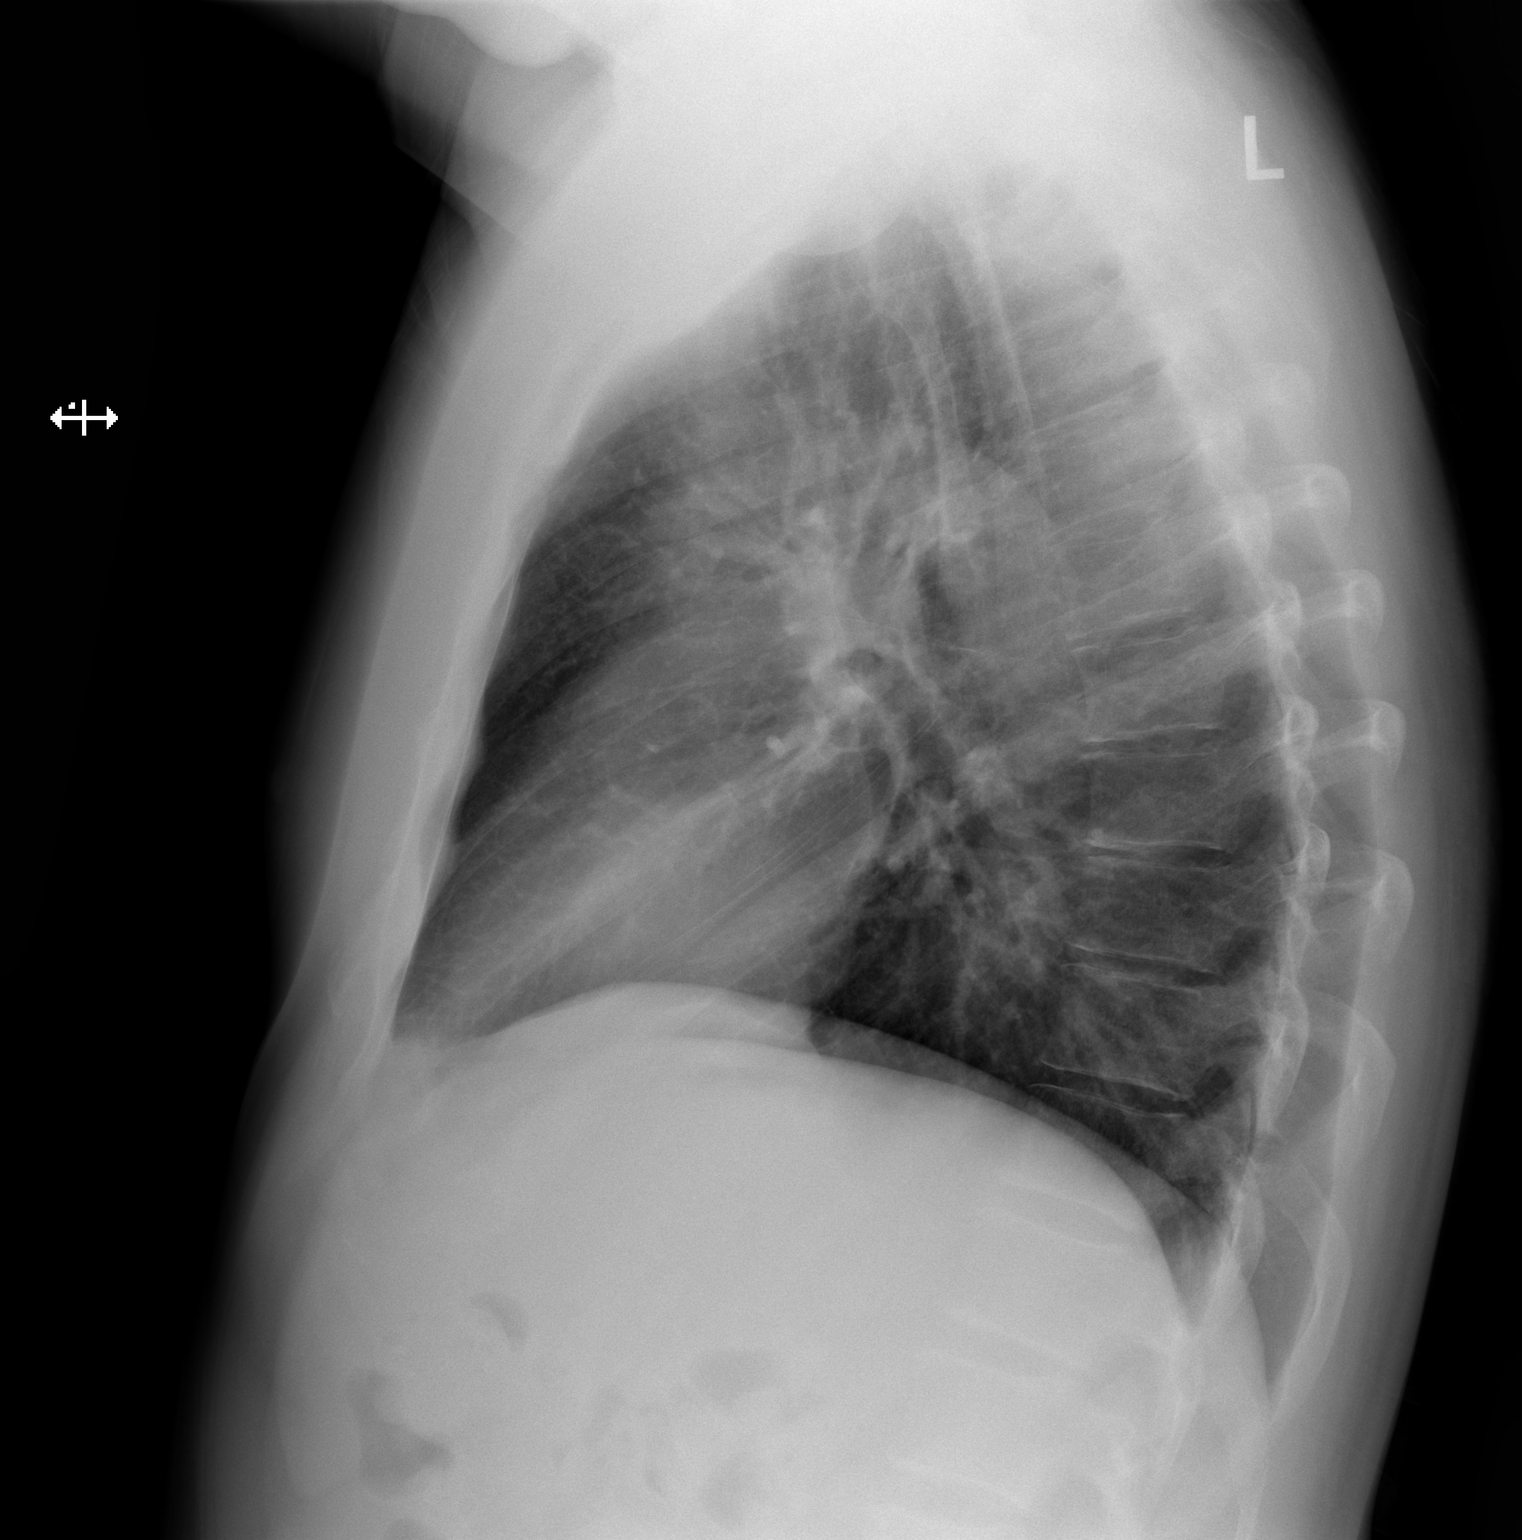

[2 of 2 positions shown; findings below may reference images not displayed]

FINDINGS: Normal heart size, mediastinal contours, and pulmonary vascularity.

Lungs clear.

No pleural effusion or pneumothorax.

Bones unremarkable.
IMPRESSION: No acute abnormalities.

## 2023-04-19 ENCOUNTER — Telehealth: Payer: Self-pay | Admitting: Gastroenterology

## 2023-04-19 NOTE — Telephone Encounter (Signed)
 Good afternoon Dr. Chales Abrahams,   We received a call from this patient's wife wishing to establish GI care with you due to Dr. Silvana Newness recent retirement. She would like patient to establish with you due to her being your patient. He was last seen by Dr. Silvana Newness office in 08/2022 for management of hemochromatosis. Records from previous visits were scanned in Media for you to review. Would you please advise on scheduling?  Thank you.

## 2023-04-19 NOTE — Telephone Encounter (Signed)
 Spoke with patient's wife, would like transfer to Dr. Chales Abrahams, due to Dr. Charm Barges retiring, she states he has a blood disease and would like to establish new GI care.

## 2023-04-23 NOTE — Telephone Encounter (Signed)
 Pl  schedule him in APP clinic-  first available. RG

## 2023-04-25 ENCOUNTER — Encounter: Payer: Self-pay | Admitting: Gastroenterology

## 2023-07-04 ENCOUNTER — Ambulatory Visit: Admitting: Gastroenterology

## 2023-08-22 ENCOUNTER — Ambulatory Visit: Payer: PRIVATE HEALTH INSURANCE | Admitting: Gastroenterology

## 2023-08-22 ENCOUNTER — Encounter: Payer: Self-pay | Admitting: Gastroenterology

## 2023-08-22 VITALS — BP 144/84 | HR 65 | Ht 72.0 in | Wt 203.0 lb

## 2023-08-22 DIAGNOSIS — K760 Fatty (change of) liver, not elsewhere classified: Secondary | ICD-10-CM

## 2023-08-22 DIAGNOSIS — K219 Gastro-esophageal reflux disease without esophagitis: Secondary | ICD-10-CM

## 2023-08-22 MED ORDER — ESOMEPRAZOLE MAGNESIUM 40 MG PO CPDR: 40.0000 mg | DELAYED_RELEASE_CAPSULE | Freq: Every day | ORAL | 4 refills | Status: DC

## 2023-08-22 NOTE — Progress Notes (Signed)
 Chief Complaint:hemochromatosis, fatty liver Primary GI Doctor:Dr. Charlanne  HPI:  Patient is a 58 year old male patient with past medical history of GERD, fatty liver, and hemochromatosis, who was self referred to me for a complaint of hemochromatosis, fatty liver .    He was last seen by Dr. Thressa office in 08/2022 for management of hemochromatosis. Labs during visit noted SGOT 45, SGPT 58, iron 133, Hgb 16.5. Ultrasound shows changes consistent with fatty liver disease with no signifcant change from the previous US  from March 2023.  Interval History    Patient presents to establish care with gastroenterologist for fatty liver, GERD, and hemochromatosis.  Patient diagnosed with liver disease in 1999. Per Dr. Vinton last follow-up note patient had liver biopsy and results were fatty liver (significant steatosis without fibrosis). Patient has required phlebotomies in the past to lower levels. He has not needed this in quite some time. Patient typically drinks 2-4 oz a day (beer or mixed drink) and social events.   Liver disease, cirrhosis : father (alcohol use).  Patient has history of GERD and taking Nexium 40 mg po daily. Patient denies dysphagia.  Patient denies nausea, vomiting, or weight loss.  Patient denies altered bowel habits, abdominal pain, or rectal bleeding.  Patient is retired and worked for the [employer].   Wt Readings from Last 3 Encounters:  08/22/23 203 lb (92.1 kg)  01/25/16 210 lb (95.3 kg)  11/27/12 211 lb 2 oz (95.8 kg)    Past Medical History:  Diagnosis Date   GERD (gastroesophageal reflux disease)    Hemochromatosis    Hypertension    Nonalcoholic fatty liver disease    PTSD (post-traumatic stress disorder)     Past Surgical History:  Procedure Laterality Date   COLONOSCOPY  06/16/2015   Dr Towana Normal colonoscopy to cecum   ESOPHAGEAL DILATION  06/2015   Dr Towana  Esophageal dilation   ESOPHAGOGASTRODUODENOSCOPY  06/16/2015   Dr Towana.  GERD esophageal stricture   LIVER BIOPSY     Preformed by Dr.Rehman 2000   WISDOM TOOTH EXTRACTION     per patient at the age of 15    Current Outpatient Medications  Medication Sig Dispense Refill   KLOR-CON M20 20 MEQ tablet 20 mEq daily. For 10 days     No current facility-administered medications for this visit.   Allergies as of 08/22/2023 - Review Complete 08/22/2023  Allergen Reaction Noted   Lidocaine Other (See Comments) 08/22/2023   Family History  Problem Relation Age of Onset   Liver disease Father    Healthy Son    Colon cancer Neg Hx    Esophageal cancer Neg Hx    Rectal cancer Neg Hx    Review of Systems:    Constitutional: No weight loss, fever, chills, weakness or fatigue HEENT: Eyes: No change in vision               Ears, Nose, Throat:  No change in hearing or congestion Skin: No rash or itching Cardiovascular: No chest pain, chest pressure or palpitations   Respiratory: No SOB or cough Gastrointestinal: See HPI and otherwise negative Genitourinary: No dysuria or change in urinary frequency Neurological: No headache, dizziness or syncope Musculoskeletal: No new muscle or joint pain Hematologic: No bleeding or bruising Psychiatric: No history of depression or anxiety    Physical Exam:  Vital signs: BP (!) 144/84   Pulse 65   Ht 6' (1.829 m)   Wt 203 lb (92.1 kg)  BMI 27.53 kg/m   Constitutional:  Pleasant male appears to be in NAD, Well developed, Well nourished, alert and cooperative Throat: Oral cavity and pharynx without inflammation, swelling or lesion.  Respiratory: Respirations even and unlabored. Lungs clear to auscultation bilaterally.   No wheezes, crackles, or rhonchi.  Cardiovascular: Normal S1, S2. Regular rate and rhythm. No peripheral edema, cyanosis or pallor.  Gastrointestinal:  Soft, nondistended, nontender. No rebound or guarding. Normal bowel sounds. No appreciable masses or hepatomegaly. Rectal:  Not performed.  Msk:   Symmetrical without gross deformities. Without edema, no deformity or joint abnormality.  Neurologic:  Alert and  oriented x4;  grossly normal neurologically.  Skin:   Dry and intact without significant lesions or rashes. Psychiatric: Oriented to person, place and time. Demonstrates good judgement and reason without abnormal affect or behaviors.  RELEVANT LABS AND IMAGING: CBC    Latest Ref Rng & Units 01/25/2016   10:06 AM 11/08/2015   11:32 AM 08/01/2013    9:30 AM  CBC  WBC 4.0 - 10.5 K/uL   5.2   Hemoglobin 13.0 - 17.0 g/dL 83.9  84.6  86.7   Hematocrit 39.0 - 52.0 %   41.0   Platelets 150 - 400 K/uL   228      CMP     Latest Ref Rng & Units 09/27/2011    9:45 AM 03/01/2011    2:21 PM 09/14/2010   10:09 AM  CMP  Glucose 70 - 99 mg/dL 875  84  83   BUN 6 - 23 mg/dL 12  14  16    Creatinine 0.50 - 1.35 mg/dL 8.76  8.78  8.70   Sodium 135 - 145 mEq/L 137  139  138   Potassium 3.5 - 5.1 mEq/L 3.3  3.6  4.1   Chloride 96 - 112 mEq/L 100  104  101   CO2 19 - 32 mEq/L 23  30  25    Calcium 8.4 - 10.5 mg/dL 9.5  9.4  9.5   Total Protein 6.0 - 8.3 g/dL 7.0  6.9  7.0   Total Bilirubin 0.3 - 1.2 mg/dL 0.6  0.5  0.6   Alkaline Phos 39 - 117 U/L 67  64  73   AST 0 - 37 U/L 25  26  27    ALT 0 - 53 U/L 30  37  35    08/02/23 labs show: hgb 16.1 , Alt 33, AST 26, alk phosp 82, folate 12.40, iron 202 05/2015 colonoscopy with Dr. Towana - normal, repeat 10 years 05/2015 EGD with Dr. Towana - esophageal stricture, erosions in esophagus   Assessment: Encounter Diagnoses  Name Primary?   Gastroesophageal reflux disease, unspecified whether esophagitis present Yes   Fatty liver    Hemochromatosis, unspecified hemochromatosis type      58 year old male patient with history of GERD and esophageal stricture requiring dilatation back in 2017, has not required since. No dysphagia. Pt will continue Nexium 40 mg po daily.     Patient also has history of fatty liver, biopsy showed fatty liver (significant  steatosis without fibrosis) per Dr. Vinton notes. Patient had recent lab work done with normal LFTs , PLT 212. Due for abd U/s RUQ to evaluate liver, will schedule today.     Patient with history of hemochromatosis, Hgb 16.1, iron 202. Will continue to monitor. Patient has required phlebotomies prn.   Patient UTD on colon screening, next due 05/2025.   Plan: -Continue Nexium 40 mg po  daily, refilled  - Continue GERD diet, no late meals 3-4 hours before  - Order RUQ abd U/S in [facility] -request EGD/colon, liver biopsy reports from Dr. Towana -1 Year fup with Dr. Charlanne  Thank you for the courtesy of this consult. Please call me with any questions or concerns.   Nikeria Kalman, FNP-C [facility] 08/22/2023, 9:39 AM  Cc: Nemiah Kemps, MD

## 2023-08-22 NOTE — Patient Instructions (Addendum)
 Continue esomeprazole 40 mg po daily, refilled GERD diet, no late meals 3-4 hours before lying down  We have sent the following medications to your pharmacy for you to pick up at your convenience: Esomeprazole 40mg   You have been scheduled for an abdominal ultrasound at [facility](1st floor of hospital) on 08/27/23 at 10:15am. Please arrive 30 minutes prior to your appointment for registration. Make certain not to have anything to eat or drink 6 hours prior to your appointment. Should you need to reschedule your appointment, please contact radiology at [phone]. This test typically takes about 30 minutes to perform.  _______________________________________________________  If your blood pressure at your visit was 140/90 or greater, please contact your primary care physician to follow up on this.  _______________________________________________________  If you are age 10 or older, your body mass index should be between 23-30. Your Body mass index is 27.53 kg/m. If this is out of the aforementioned range listed, please consider follow up with your Primary Care Provider.  If you are age 35 or younger, your body mass index should be between 19-25. Your Body mass index is 27.53 kg/m. If this is out of the aformentioned range listed, please consider follow up with your Primary Care Provider.   ________________________________________________________  The [facility] providers would like to encourage you to use MYCHART to communicate with providers for non-urgent requests or questions.  Due to long hold times on the telephone, sending your provider a message by [facility] Annjanette Wertenberger be a faster and more efficient way to get a response.  Please allow 48 business hours for a response.  Please remember that this is for non-urgent requests.  _______________________________________________________  Thank you for trusting me with your gastrointestinal care. Antasia Haider, RNP

## 2023-08-27 ENCOUNTER — Ambulatory Visit: Payer: Self-pay | Admitting: Gastroenterology

## 2023-08-27 ENCOUNTER — Ambulatory Visit (INDEPENDENT_AMBULATORY_CARE_PROVIDER_SITE_OTHER)
Admission: RE | Admit: 2023-08-27 | Discharge: 2023-08-27 | Disposition: A | Discharge status: Home / Self Care | Payer: Self-pay | Source: Ambulatory Visit | Attending: Gastroenterology | Admitting: Gastroenterology

## 2023-08-27 DIAGNOSIS — K760 Fatty (change of) liver, not elsewhere classified: Secondary | ICD-10-CM

## 2023-08-29 ENCOUNTER — Telehealth: Payer: Self-pay | Admitting: Gastroenterology

## 2023-08-29 NOTE — Telephone Encounter (Signed)
 Received endoscopy report from 07/12/2015 with Dr. Towana Impression esophageal stricture.  Esophageal dilatation was performed bypassing series of various size dilators into the esophagus.  Patient tolerated well.  EGD 06/16/2015 with Dr. Towana for dysphagia Multiple erosions in distal esophagus Biopsies obtained from lower third of the esophagus to rule out EOE Biopsies obtained from mid and upper third of the esophagus to rule out EOE Esophageal stricture with lumen about 15 mm Path: Esophagus biopsy lower third Focal inflammation Pattern is nonspecific and likely represents reflux related changes.  The remaining squamous fragments do not show increased and acidophil's or other features of EOE. Esophagus biopsy mid and upper third Normal squamous mucosa Stain negative for fungus No dysplasia or malignancy  Colonoscopy 06/16/2015 with Dr. Towana for colon cancer screening Impression normal colonoscopy to cecum Follow-up colonoscopy in 10 years

## 2023-08-31 ENCOUNTER — Other Ambulatory Visit: Payer: Self-pay | Admitting: *Deleted

## 2024-02-07 ENCOUNTER — Telehealth: Payer: Self-pay | Admitting: Gastroenterology

## 2024-02-07 DIAGNOSIS — K219 Gastro-esophageal reflux disease without esophagitis: Secondary | ICD-10-CM

## 2024-02-07 MED ORDER — ESOMEPRAZOLE MAGNESIUM 40 MG PO CPDR: 40.0000 mg | DELAYED_RELEASE_CAPSULE | Freq: Every day | ORAL | 6 refills | Status: AC

## 2024-02-07 NOTE — Telephone Encounter (Signed)
 Rx for esomeprazole  sent to [facility] in [place] as requested.  Patient aware.

## 2024-02-07 NOTE — Telephone Encounter (Signed)
 Inbound call from patient stating that he is needing a refill on his esomeprazole  40 MG. Patient would like a call back to advise him that the medication has been sent to the pharmacy. Please advise.
